# Patient Record
Sex: Female | Born: 1995 | Race: White | Hispanic: No | Marital: Single | Smoking: Current some day smoker
Health system: Southern US, Community
[De-identification: ages and names within clinical notes are randomized; demographics above are authoritative.]

## PROBLEM LIST (undated history)

## (undated) VITALS — BP 95/58 | HR 92 | Temp 98.3°F | Resp 16 | Ht 63.39 in | Wt 133.4 lb

## (undated) DIAGNOSIS — F329 Major depressive disorder, single episode, unspecified: Secondary | ICD-10-CM

## (undated) DIAGNOSIS — F99 Mental disorder, not otherwise specified: Secondary | ICD-10-CM

## (undated) DIAGNOSIS — F32A Depression, unspecified: Secondary | ICD-10-CM

## (undated) HISTORY — PX: ABDOMINAL HERNIA REPAIR: SHX539

---
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
## 2016-02-19 DIAGNOSIS — R011 Cardiac murmur, unspecified: Secondary | ICD-10-CM | POA: Diagnosis not present

## 2016-02-19 DIAGNOSIS — Z23 Encounter for immunization: Secondary | ICD-10-CM | POA: Diagnosis not present

## 2016-02-19 DIAGNOSIS — Z0001 Encounter for general adult medical examination with abnormal findings: Secondary | ICD-10-CM | POA: Diagnosis not present

## 2016-03-19 ENCOUNTER — Other Ambulatory Visit (HOSPITAL_COMMUNITY): Payer: Self-pay | Admitting: Family

## 2016-03-19 DIAGNOSIS — R011 Cardiac murmur, unspecified: Secondary | ICD-10-CM

## 2016-04-01 DIAGNOSIS — Z113 Encounter for screening for infections with a predominantly sexual mode of transmission: Secondary | ICD-10-CM | POA: Diagnosis not present

## 2016-04-01 DIAGNOSIS — Z6822 Body mass index (BMI) 22.0-22.9, adult: Secondary | ICD-10-CM | POA: Diagnosis not present

## 2016-04-01 DIAGNOSIS — Z01419 Encounter for gynecological examination (general) (routine) without abnormal findings: Secondary | ICD-10-CM | POA: Diagnosis not present

## 2016-04-07 ENCOUNTER — Other Ambulatory Visit: Payer: Self-pay

## 2016-04-07 ENCOUNTER — Ambulatory Visit (HOSPITAL_COMMUNITY): Payer: BLUE CROSS/BLUE SHIELD | Attending: Cardiology

## 2016-04-07 DIAGNOSIS — R011 Cardiac murmur, unspecified: Secondary | ICD-10-CM | POA: Diagnosis not present

## 2016-04-07 LAB — ECHOCARDIOGRAM COMPLETE
CHL CUP DOP CALC LVOT VTI: 25 cm
CHL CUP MV DEC (S): 257
CHL CUP RV SYS PRESS: 13 mmHg
E/e' ratio: 5.01
EWDT: 257 ms
FS: 36 % (ref 28–44)
IV/PV OW: 1.12
LA ID, A-P, ES: 27 mm
LADIAMINDEX: 1.63 cm/m2
LAVOLA4C: 26.4 mL
LDCA: 2.54 cm2
LEFT ATRIUM END SYS DIAM: 27 mm
LV PW d: 7.1 mm — AB (ref 0.6–1.1)
LV TDI E'LATERAL: 19.1
LVEEAVG: 5.01
LVEEMED: 5.01
LVELAT: 19.1 cm/s
LVOT peak vel: 97.5 cm/s
LVOTD: 18 mm
LVOTSV: 64 mL
MVPG: 4 mmHg
MVPKAVEL: 41.2 m/s
MVPKEVEL: 95.6 m/s
Peak grad: 157 mmHg
RV LATERAL S' VELOCITY: 13.2 cm/s
Reg peak vel: 157 cm/s
TAPSE: 20 mm
TDI e' medial: 13.7
TR max vel: 157 cm/s

## 2016-05-04 DIAGNOSIS — Z3200 Encounter for pregnancy test, result unknown: Secondary | ICD-10-CM | POA: Diagnosis not present

## 2016-05-04 DIAGNOSIS — N870 Mild cervical dysplasia: Secondary | ICD-10-CM | POA: Diagnosis not present

## 2016-05-04 DIAGNOSIS — R87610 Atypical squamous cells of undetermined significance on cytologic smear of cervix (ASC-US): Secondary | ICD-10-CM | POA: Diagnosis not present

## 2016-05-17 DIAGNOSIS — R300 Dysuria: Secondary | ICD-10-CM | POA: Diagnosis not present

## 2016-05-17 DIAGNOSIS — N390 Urinary tract infection, site not specified: Secondary | ICD-10-CM | POA: Diagnosis not present

## 2016-06-08 DIAGNOSIS — Z113 Encounter for screening for infections with a predominantly sexual mode of transmission: Secondary | ICD-10-CM | POA: Diagnosis not present

## 2016-06-08 DIAGNOSIS — R399 Unspecified symptoms and signs involving the genitourinary system: Secondary | ICD-10-CM | POA: Diagnosis not present

## 2016-06-08 DIAGNOSIS — R8299 Other abnormal findings in urine: Secondary | ICD-10-CM | POA: Diagnosis not present

## 2016-06-11 DIAGNOSIS — R300 Dysuria: Secondary | ICD-10-CM | POA: Diagnosis not present

## 2016-06-17 DIAGNOSIS — R300 Dysuria: Secondary | ICD-10-CM | POA: Diagnosis not present

## 2016-06-17 DIAGNOSIS — R011 Cardiac murmur, unspecified: Secondary | ICD-10-CM | POA: Diagnosis not present

## 2016-06-17 DIAGNOSIS — R319 Hematuria, unspecified: Secondary | ICD-10-CM | POA: Diagnosis not present

## 2016-06-29 DIAGNOSIS — Z79899 Other long term (current) drug therapy: Secondary | ICD-10-CM | POA: Diagnosis not present

## 2016-06-29 DIAGNOSIS — N739 Female pelvic inflammatory disease, unspecified: Secondary | ICD-10-CM | POA: Diagnosis not present

## 2016-06-29 DIAGNOSIS — R102 Pelvic and perineal pain: Secondary | ICD-10-CM | POA: Diagnosis not present

## 2016-06-29 DIAGNOSIS — F17200 Nicotine dependence, unspecified, uncomplicated: Secondary | ICD-10-CM | POA: Diagnosis not present

## 2016-06-29 DIAGNOSIS — R1030 Lower abdominal pain, unspecified: Secondary | ICD-10-CM | POA: Diagnosis not present

## 2016-06-29 DIAGNOSIS — N730 Acute parametritis and pelvic cellulitis: Secondary | ICD-10-CM | POA: Diagnosis not present

## 2016-06-29 DIAGNOSIS — R109 Unspecified abdominal pain: Secondary | ICD-10-CM | POA: Diagnosis not present

## 2016-06-29 NOTE — ED Triage Notes (Signed)
PT C/O LOWER ABDOMINAL PAIN X8 DAYS. PT STS SHE WENT TO HER PCP TODAY, AND WAS TESTED FOR A POSSIBLE UTI, THE RESULT WAS NEGATIVE. SHE STS TODAY SHE BEGAN WITH CONSTANT NAUSEA. DENIES VOMITING OR DIARRHEA. PT STS THIS PAIN STARTED WHEN HER MENSES BEGAN, BUT HER MENSES IS OVER, BUT THE PAIN IS INCREASING.

## 2016-06-30 ENCOUNTER — Emergency Department (HOSPITAL_COMMUNITY)
Admission: EM | Admit: 2016-06-30 | Discharge: 2016-06-30 | Disposition: A | Discharge status: Home / Self Care | Payer: BLUE CROSS/BLUE SHIELD | Attending: Emergency Medicine | Admitting: Emergency Medicine

## 2016-06-30 ENCOUNTER — Encounter (HOSPITAL_COMMUNITY): Payer: Self-pay

## 2016-06-30 ENCOUNTER — Emergency Department (HOSPITAL_COMMUNITY): Payer: BLUE CROSS/BLUE SHIELD

## 2016-06-30 DIAGNOSIS — R109 Unspecified abdominal pain: Secondary | ICD-10-CM | POA: Diagnosis not present

## 2016-06-30 DIAGNOSIS — R102 Pelvic and perineal pain: Secondary | ICD-10-CM

## 2016-06-30 DIAGNOSIS — N730 Acute parametritis and pelvic cellulitis: Secondary | ICD-10-CM

## 2016-06-30 LAB — URINALYSIS, ROUTINE W REFLEX MICROSCOPIC
BILIRUBIN URINE: NEGATIVE
GLUCOSE, UA: NEGATIVE mg/dL
HGB URINE DIPSTICK: NEGATIVE
Ketones, ur: NEGATIVE mg/dL
Leukocytes, UA: NEGATIVE
Nitrite: NEGATIVE
Protein, ur: NEGATIVE mg/dL
SPECIFIC GRAVITY, URINE: 1.006 (ref 1.005–1.030)
pH: 6.5 (ref 5.0–8.0)

## 2016-06-30 LAB — GC/CHLAMYDIA PROBE AMP (~~LOC~~) NOT AT ARMC
Chlamydia: NEGATIVE
Neisseria Gonorrhea: NEGATIVE

## 2016-06-30 LAB — CBC
HCT: 38.8 % (ref 36.0–46.0)
Hemoglobin: 13.3 g/dL (ref 12.0–15.0)
MCH: 31 pg (ref 26.0–34.0)
MCHC: 34.3 g/dL (ref 30.0–36.0)
MCV: 90.4 fL (ref 78.0–100.0)
PLATELETS: 334 10*3/uL (ref 150–400)
RBC: 4.29 MIL/uL (ref 3.87–5.11)
RDW: 12.7 % (ref 11.5–15.5)
WBC: 7.1 10*3/uL (ref 4.0–10.5)

## 2016-06-30 LAB — COMPREHENSIVE METABOLIC PANEL
ALBUMIN: 4.2 g/dL (ref 3.5–5.0)
ALK PHOS: 60 U/L (ref 38–126)
ALT: 42 U/L (ref 14–54)
AST: 30 U/L (ref 15–41)
Anion gap: 5 (ref 5–15)
BUN: 13 mg/dL (ref 6–20)
CALCIUM: 9.1 mg/dL (ref 8.9–10.3)
CO2: 26 mmol/L (ref 22–32)
CREATININE: 0.61 mg/dL (ref 0.44–1.00)
Chloride: 108 mmol/L (ref 101–111)
GFR calc Af Amer: >60 mL/min (ref >60)
GFR calc non Af Amer: >60 mL/min (ref >60)
GLUCOSE: 100 mg/dL — AB (ref 65–99)
Potassium: 3.3 mmol/L — ABNORMAL LOW (ref 3.5–5.1)
Sodium: 139 mmol/L (ref 135–145)
Total Bilirubin: 0.5 mg/dL (ref 0.3–1.2)
Total Protein: 7.1 g/dL (ref 6.5–8.1)

## 2016-06-30 LAB — WET PREP, GENITAL
Clue Cells Wet Prep HPF POC: NONE SEEN
SPERM: NONE SEEN
TRICH WET PREP: NONE SEEN
WBC WET PREP: NONE SEEN
Yeast Wet Prep HPF POC: NONE SEEN

## 2016-06-30 LAB — LIPASE, BLOOD: Lipase: 27 U/L (ref 11–51)

## 2016-06-30 LAB — HCG, QUANTITATIVE, PREGNANCY

## 2016-06-30 MED ORDER — CEFTRIAXONE SODIUM 250 MG IJ SOLR
250.0000 mg | Freq: Once | INTRAMUSCULAR | Status: AC
  Administered 2016-06-30: 250 mg via INTRAMUSCULAR
  Filled 2016-06-30: qty 250

## 2016-06-30 MED ORDER — NAPROXEN 500 MG PO TABS: 500.0000 mg | ORAL_TABLET | Freq: Two times a day (BID) | ORAL | 0 refills | Status: AC

## 2016-06-30 MED ORDER — LIDOCAINE HCL 1 % IJ SOLN
INTRAMUSCULAR | Status: AC
  Administered 2016-06-30: 2 mL
  Filled 2016-06-30: qty 20

## 2016-06-30 MED ORDER — KETOROLAC TROMETHAMINE 30 MG/ML IJ SOLN
30.0000 mg | Freq: Once | INTRAMUSCULAR | Status: AC
  Administered 2016-06-30: 30 mg via INTRAVENOUS
  Filled 2016-06-30: qty 1

## 2016-06-30 MED ORDER — DOXYCYCLINE HYCLATE 100 MG PO CAPS: 100.0000 mg | ORAL_CAPSULE | Freq: Two times a day (BID) | ORAL | 0 refills | Status: AC

## 2016-06-30 MED ORDER — IOPAMIDOL (ISOVUE-300) INJECTION 61%
100.0000 mL | Freq: Once | INTRAVENOUS | Status: AC | PRN
  Administered 2016-06-30: 100 mL via INTRAVENOUS

## 2016-06-30 MED ORDER — POLYETHYLENE GLYCOL 3350 17 GM/SCOOP PO POWD: 17.0000 g | Freq: Every day | ORAL | 0 refills | Status: AC

## 2016-06-30 MED ORDER — ONDANSETRON HCL 4 MG/2ML IJ SOLN
4.0000 mg | Freq: Once | INTRAMUSCULAR | Status: AC
  Administered 2016-06-30: 4 mg via INTRAVENOUS
  Filled 2016-06-30: qty 2

## 2016-06-30 NOTE — ED Notes (Signed)
In CT

## 2016-06-30 NOTE — ED Provider Notes (Signed)
[facility]-EMERGENCY DEPT Provider Note   CSN: [id number] Arrival date & time: 06/29/16  2352 By signing my name below, I, Anita Cook, attest that this documentation has been prepared under the direction and in the presence of non-physician practitioner, Antony MaduraKelly Xan Ingraham, PA-C Electronically Signed: Levon HedgerElizabeth Cook, Scribe. 06/30/2016. 1:31 AM.   History   Chief Complaint Chief Complaint  Patient presents with  . Abdominal Pain    HPI Anita Cook is a 20 y.o. female who presents to the Emergency Department complaining of gradually worsening, constant abdominal pain. Pt states the pain is mostly in her lower abdomen, but will occasionally move to different locations in her abdomen. She describes the pain as "hot", sharp, and shooting. Pain is unchanged by eating or bowel movements. She has never had theses symptoms before. She notes associated constant nausea. She states she has had 2 UTI's in the past 2 months and an abnormal pap smear. Her partner has herpes type 2; pt tested negative for STD's a few months ago. Pt denies fever, dysuria, vaginal bleeding, vaginal discharge, abnormal menstrual cycle. No abdominal shx.   The history is provided by the patient. No language interpreter was used.    Past Medical History:  Diagnosis Date  . Depression   . Mental disorder     Patient Active Problem List   Diagnosis Date Noted  . Recurrent major depression-severe (HCC) 06/14/2012  . GAD (generalized anxiety disorder) 06/14/2012  . Oppositional defiant disorder 06/14/2012  . Cannabis abuse 06/14/2012  . Acute stress disorder 06/14/2012    Past Surgical History:  Procedure Laterality Date  . ABDOMINAL HERNIA REPAIR  newborn  . WISDOM TOOTH EXTRACTION  2012    OB History    No data available     Home Medications    Prior to Admission medications   Medication Sig Start Date End Date Taking? Authorizing Provider  Multiple Vitamins-Minerals (MULTI-VITAMIN GUMMIES PO) Take 1 tablet by  mouth daily.   Yes Historical Provider, MD  clorazepate (TRANXENE) 7.5 MG tablet Take 1 tablet (7.5 mg total) by mouth 3 (three) times daily as needed (acute stress anxiety). Patient not taking: Reported on 06/30/2016 06/21/12   Jolene SchimkeKim B Winson, NP  doxycycline (VIBRAMYCIN) 100 MG capsule Take 1 capsule (100 mg total) by mouth 2 (two) times daily. 06/30/16   Antony MaduraKelly Lulabelle Desta, PA-C  lamoTRIgine (LAMICTAL) 200 MG tablet Take 1 tablet (200 mg total) by mouth at bedtime. Patient not taking: Reported on 06/30/2016 06/21/12   Jolene SchimkeKim B Winson, NP  mirtazapine (REMERON) 15 MG tablet Take 1 tablet (15 mg total) by mouth at bedtime. Patient not taking: Reported on 06/30/2016 06/21/12   Jolene SchimkeKim B Winson, NP  naproxen (NAPROSYN) 500 MG tablet Take 1 tablet (500 mg total) by mouth 2 (two) times daily. 06/30/16   Antony MaduraKelly Hampton Cost, PA-C  PARoxetine (PAXIL) 10 MG tablet Take 1.5 pills (15mg  total) by mouth for two weeks, Take 1 pills (10mg  total) by mouth for two weeks, then 1/2 pill (5 mg total) by mouth for two weeks.  Patient is on a medication taper. Patient not taking: Reported on 06/30/2016 06/21/12   Jolene SchimkeKim B Winson, NP  polyethylene glycol powder (GLYCOLAX/MIRALAX) powder Take 17 g by mouth daily. Until daily soft stools  OTC 06/30/16   Antony MaduraKelly Shelby Anderle, PA-C    Family History Family History  Problem Relation Age of Onset  . Bipolar disorder Mother   . Drug abuse Mother   . ADD / ADHD Brother   . Autism Cousin   .  Autism Cousin    Social History Social History  Substance Use Topics  . Smoking status: Current Some Day Smoker    Packs/day: 1.00    Years: 5.00  . Smokeless tobacco: Never Used  . Alcohol use No     Comment: OCCASIONAL    Allergies   Review of patient's allergies indicates no known allergies.   Review of Systems Review of Systems  10 systems reviewed and all are negative for acute change except as noted in the HPI.  Physical Exam Updated Vital Signs BP 105/62 (BP Location: Right Arm)   Pulse 67   Temp  97.9 F (36.6 C) (Oral)   Resp 20   Ht 5\' 4"  (1.626 m)   Wt 56.7 kg   LMP 06/23/2016 Comment: negative beta HCG 06/30/16  SpO2 98%   BMI 21.46 kg/m   Physical Exam  Constitutional: She is oriented to person, place, and time. She appears well-developed and well-nourished. No distress.  Nontoxic appearing, in NAD  HENT:  Head: Normocephalic and atraumatic.  Eyes: Conjunctivae and EOM are normal. No scleral icterus.  Neck: Normal range of motion.  Cardiovascular: Normal rate, regular rhythm and intact distal pulses.   Pulmonary/Chest: Effort normal. No respiratory distress. She has no wheezes. She has no rales.  Lungs CTAB  Abdominal: Soft. She exhibits no distension and no mass. There is tenderness. There is no guarding.  RLQ and LLQ. There is also suprapubic TTP. No peritoneal signs or masses.  Genitourinary: There is no rash, tenderness or lesion on the right labia. There is no rash, tenderness or lesion on the left labia. Uterus is tender. Cervix exhibits no motion tenderness and no friability. Right adnexum displays tenderness. Right adnexum displays no mass and no fullness. Left adnexum displays tenderness. Left adnexum displays no mass and no fullness. No bleeding in the vagina. No foreign body in the vagina. No vaginal discharge found.  Genitourinary Comments: Adnexal and uterine TTP. No cervical friability or discharge.  Musculoskeletal: Normal range of motion.  Neurological: She is alert and oriented to person, place, and time. She exhibits normal muscle tone. Coordination normal.  GCS 15.   Skin: Skin is warm and dry. No rash noted. She is not diaphoretic. No erythema. No pallor.  Psychiatric: She has a normal mood and affect. Her behavior is normal.  Nursing note and vitals reviewed.   ED Treatments / Results  DIAGNOSTIC STUDIES:  Oxygen Saturation is 100% on RA, normal by my interpretation.    COORDINATION OF CARE:  1:31 AM Discussed treatment plan with pt at bedside  and pt agreed to plan.   Labs (all labs ordered are listed, but only abnormal results are displayed) Labs Reviewed  COMPREHENSIVE METABOLIC PANEL - Abnormal; Notable for the following:       Result Value   Potassium 3.3 (*)    Glucose, Bld 100 (*)    All other components within normal limits  WET PREP, GENITAL  LIPASE, BLOOD  CBC  URINALYSIS, ROUTINE W REFLEX MICROSCOPIC (NOT AT [facility])  HCG, QUANTITATIVE, PREGNANCY  GC/CHLAMYDIA PROBE AMP ([place]) NOT AT [facility]    EKG  EKG Interpretation None       Radiology US Transvaginal Non-ob  Result Date: 06/30/2016 CLINICAL DATA:  20 year old female with pelvic pain. EXAM: TRANSABDOMINAL AND TRANSVAGINAL ULTRASOUND OF PELVIS DOPPLER ULTRASOUND OF OVARIES TECHNIQUE: Both transabdominal and transvaginal ultrasound examinations of the pelvis were performed. Transabdominal technique was performed for global imaging of the pelvis including uterus, ovaries,  adnexal regions, and pelvic cul-de-sac. It was necessary to proceed with endovaginal exam following the transabdominal exam to visualize the endometrium and ovaries. Color and duplex Doppler ultrasound was utilized to evaluate blood flow to the ovaries. COMPARISON:  None. FINDINGS: Uterus Measurements: 7.0 x 3.1 x 4.4 cm. No fibroids or other mass visualized. Endometrium Thickness: 0.5 mm.  No focal abnormality visualized. Right ovary Measurements: 3.1 x 2.6 x 2.5 cm. Normal appearance/no adnexal mass. Left ovary Measurements: 3.5 x 2.3 x 1.9 cm. Normal appearance/no adnexal mass. Pulsed Doppler evaluation of both ovaries demonstrates normal low-resistance arterial and venous waveforms. Other findings Trace free fluid within the pelvis. IMPRESSION: Unremarkable pelvic ultrasound. Electronically Signed   By: Elgie Collard M.D.   On: 06/30/2016 02:58   US Pelvis Complete  Result Date: 06/30/2016 CLINICAL DATA:  20 year old female with pelvic pain. EXAM: TRANSABDOMINAL AND TRANSVAGINAL  ULTRASOUND OF PELVIS DOPPLER ULTRASOUND OF OVARIES TECHNIQUE: Both transabdominal and transvaginal ultrasound examinations of the pelvis were performed. Transabdominal technique was performed for global imaging of the pelvis including uterus, ovaries, adnexal regions, and pelvic cul-de-sac. It was necessary to proceed with endovaginal exam following the transabdominal exam to visualize the endometrium and ovaries. Color and duplex Doppler ultrasound was utilized to evaluate blood flow to the ovaries. COMPARISON:  None. FINDINGS: Uterus Measurements: 7.0 x 3.1 x 4.4 cm. No fibroids or other mass visualized. Endometrium Thickness: 0.5 mm.  No focal abnormality visualized. Right ovary Measurements: 3.1 x 2.6 x 2.5 cm. Normal appearance/no adnexal mass. Left ovary Measurements: 3.5 x 2.3 x 1.9 cm. Normal appearance/no adnexal mass. Pulsed Doppler evaluation of both ovaries demonstrates normal low-resistance arterial and venous waveforms. Other findings Trace free fluid within the pelvis. IMPRESSION: Unremarkable pelvic ultrasound. Electronically Signed   By: Elgie Collard M.D.   On: 06/30/2016 02:58   Ct Abdomen Pelvis W Contrast  Result Date: 06/30/2016 CLINICAL DATA:  Increasing constant abdominal pain with nausea. Recurrent urinary tract infections. EXAM: CT ABDOMEN AND PELVIS WITH CONTRAST TECHNIQUE: Multidetector CT imaging of the abdomen and pelvis was performed using the standard protocol following bolus administration of intravenous contrast. CONTRAST:  ISOVUE-300 IOPAMIDOL (ISOVUE-300) INJECTION 61% COMPARISON:  Pelvic ultrasound June 30, 2016 at 0222 hours FINDINGS: LOWER CHEST: Lung bases are clear. Included heart size is normal. No pericardial effusion. HEPATOBILIARY: Liver and gallbladder are normal. PANCREAS: Normal. SPLEEN: Normal. ADRENALS/URINARY TRACT: Kidneys are orthotopic, demonstrating symmetric enhancement. No nephrolithiasis, hydronephrosis or solid renal masses. The unopacified  ureters are normal in course and caliber. Urinary bladder is partially distended and unremarkable. Normal adrenal glands. STOMACH/BOWEL: The stomach, small and large bowel are normal in course and caliber without inflammatory changes. Moderate amount of retained large bowel stool. Small amount of small bowel feces compatible with chronic stasis. The appendix is not discretely identified, however there are no inflammatory changes in the right lower quadrant. VASCULAR/LYMPHATIC: Aortoiliac vessels are normal in course and caliber. No lymphadenopathy by CT size criteria. REPRODUCTIVE: Normal. OTHER: No intraperitoneal free fluid or free air. MUSCULOSKELETAL: Nonacute.  Broad levoscoliosis could be positional. IMPRESSION: No acute intra-abdominal or pelvic process. Moderate amount of retained large bowel stool without bowel obstruction. Electronically Signed   By: Awilda Metro M.D.   On: 06/30/2016 04:49   [place] Art/ven Flow Abd Pelv Doppler  Result Date: 06/30/2016 CLINICAL DATA:  20 year old female with pelvic pain. EXAM: TRANSABDOMINAL AND TRANSVAGINAL ULTRASOUND OF PELVIS DOPPLER ULTRASOUND OF OVARIES TECHNIQUE: Both transabdominal and transvaginal ultrasound examinations of the pelvis were performed. Transabdominal  technique was performed for global imaging of the pelvis including uterus, ovaries, adnexal regions, and pelvic cul-de-sac. It was necessary to proceed with endovaginal exam following the transabdominal exam to visualize the endometrium and ovaries. Color and duplex Doppler ultrasound was utilized to evaluate blood flow to the ovaries. COMPARISON:  None. FINDINGS: Uterus Measurements: 7.0 x 3.1 x 4.4 cm. No fibroids or other mass visualized. Endometrium Thickness: 0.5 mm.  No focal abnormality visualized. Right ovary Measurements: 3.1 x 2.6 x 2.5 cm. Normal appearance/no adnexal mass. Left ovary Measurements: 3.5 x 2.3 x 1.9 cm. Normal appearance/no adnexal mass. Pulsed Doppler evaluation of both  ovaries demonstrates normal low-resistance arterial and venous waveforms. Other findings Trace free fluid within the pelvis. IMPRESSION: Unremarkable pelvic ultrasound. Electronically Signed   By: Elgie Collard M.D.   On: 06/30/2016 02:58    Procedures Procedures (including critical care time)  Medications Ordered in ED Medications  ketorolac (TORADOL) 30 MG/ML injection 30 mg (30 mg Intravenous Given 06/30/16 0213)  ondansetron (ZOFRAN) injection 4 mg (4 mg Intravenous Given 06/30/16 0213)  iopamidol (ISOVUE-300) 61 % injection 100 mL (100 mLs Intravenous Contrast Given 06/30/16 0425)  cefTRIAXone (ROCEPHIN) injection 250 mg (250 mg Intramuscular Given 06/30/16 0521)  lidocaine (XYLOCAINE) 1 % (with pres) injection (2 mLs  Given 06/30/16 0521)     Initial Impression / Assessment and Plan / ED Course  I have reviewed the triage vital signs and the nursing notes.  Pertinent labs & imaging results that were available during my care of the patient were reviewed by me and considered in my medical decision making (see chart for details).  Clinical Course    20 year old female presents to the emergency department for persistent lower abdominal and pelvic pain 2-3 weeks.  She is well appearing and afebrile.  Laboratory workup is reassuring.  There is no evidence of urinary tract infection.  Pregnancy test is negative.  Ultrasound imaging of reproductive organs obtained.  There is no evidence of ovarian torsion, ovarian cyst, or TOA.  Given degree of discomfort, CT obtained, which is also negative for acute process.  Given location of symptoms, plan to treat for pelvic inflammatory disease.  The patient has been referred to her OB/GYN as well as a gastroenterologist if pain persists.  Return precautions discussed and provided.  Patient discharged in satisfactory condition with no unaddressed concerns.   Final Clinical Impressions(s) / ED Diagnoses   Final diagnoses:  PID (acute pelvic  inflammatory disease)    New Prescriptions New Prescriptions   DOXYCYCLINE (VIBRAMYCIN) 100 MG CAPSULE    Take 1 capsule (100 mg total) by mouth 2 (two) times daily.   NAPROXEN (NAPROSYN) 500 MG TABLET    Take 1 tablet (500 mg total) by mouth 2 (two) times daily.   POLYETHYLENE GLYCOL POWDER (GLYCOLAX/MIRALAX) POWDER    Take 17 g by mouth daily. Until daily soft stools  OTC   I personally performed the services described in this documentation, which was scribed in my presence. The recorded information has been reviewed and is accurate.      Antony Madura, PA-C 06/30/16 0542    Tilden Fossa, MD 06/30/16 [phone]

## 2016-06-30 NOTE — Discharge Instructions (Signed)
Take naproxen as needed for pain and doxycycline for pelvic inflammatory disease.  Do not stop your antibiotics early; take the full course.  Follow up with an OB/GYN regarding your visit to the emergency department today.  You may also follow up with a gastroenterologist if you are abdominal pain persists.  There are CT showed that you may be constipated.  You have been prescribed MiraLAX for this reason, to take daily.  Return to the emergency department for any new or concerning symptoms.

## 2016-06-30 NOTE — ED Notes (Signed)
US in room 

## 2016-07-06 DIAGNOSIS — N760 Acute vaginitis: Secondary | ICD-10-CM | POA: Diagnosis not present

## 2016-07-06 DIAGNOSIS — K5900 Constipation, unspecified: Secondary | ICD-10-CM | POA: Diagnosis not present

## 2016-07-06 DIAGNOSIS — R102 Pelvic and perineal pain: Secondary | ICD-10-CM | POA: Diagnosis not present

## 2016-07-06 DIAGNOSIS — R1030 Lower abdominal pain, unspecified: Secondary | ICD-10-CM | POA: Diagnosis not present

## 2016-07-06 DIAGNOSIS — Z113 Encounter for screening for infections with a predominantly sexual mode of transmission: Secondary | ICD-10-CM | POA: Diagnosis not present

## 2016-10-07 DIAGNOSIS — M79641 Pain in right hand: Secondary | ICD-10-CM | POA: Diagnosis not present

## 2016-11-06 IMAGING — CT CT ABD-PELV W/ CM
2 of 4 series · 16 of 46 positions shown, 18 images · IV contrast (ISOVUE)
Comparison: Pelvic ultrasound June 30, 2016 at 8666 hours

CLINICAL DATA: Increasing constant abdominal pain with nausea.
Recurrent urinary tract infections.

EXAM:
CT ABDOMEN AND PELVIS WITH CONTRAST
TECHNIQUE: Multidetector CT imaging of the abdomen and pelvis was performed
using the standard protocol following bolus administration of
intravenous contrast.
CONTRAST:  100mL N5I3TI-SOO IOPAMIDOL (N5I3TI-SOO) INJECTION 61%

[Series 2: abd/pel with · axial · 0.66mm/px · z∈[+1241,+1631]mm · 13 of 86 slices shown, 15 images]
[im 4/86  soft-tissue]
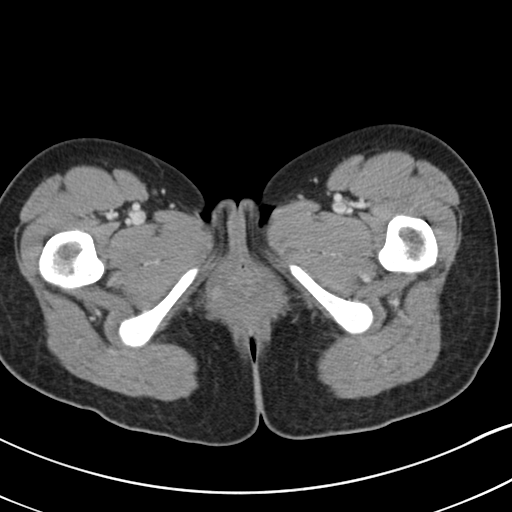
[im 4/86  bone]
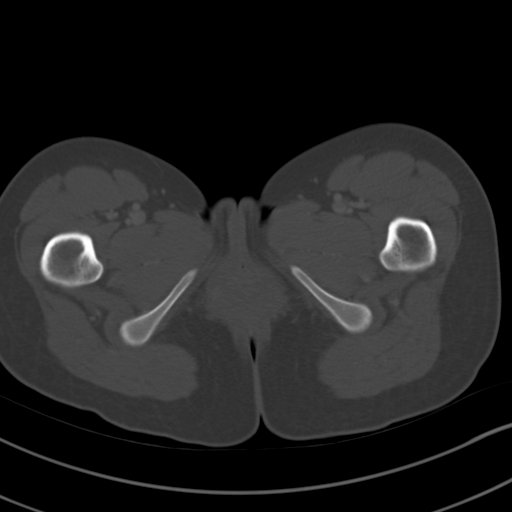
[im 12/86  soft-tissue]
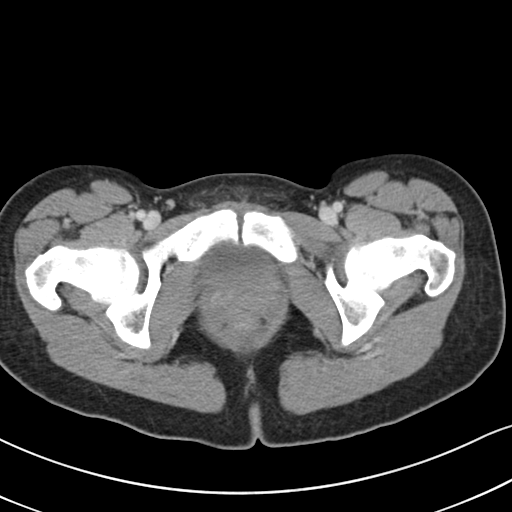
[im 19/86  soft-tissue]
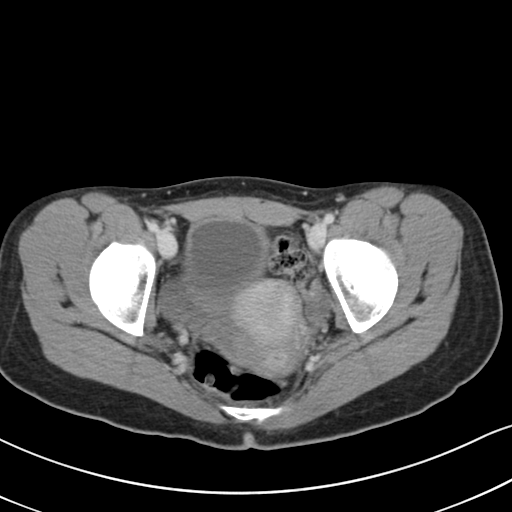
[im 23/86  soft-tissue]
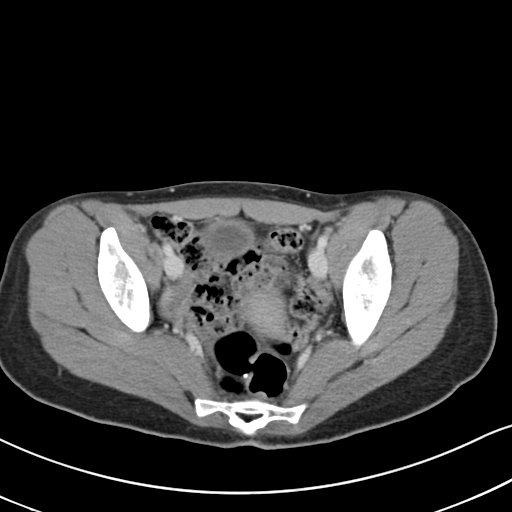
[im 30/86  soft-tissue]
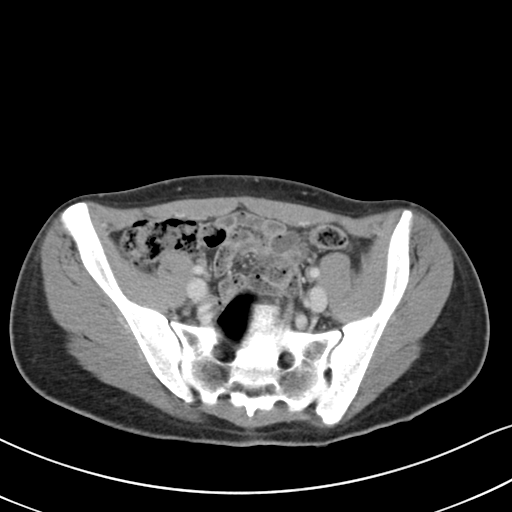
[im 37/86  soft-tissue]
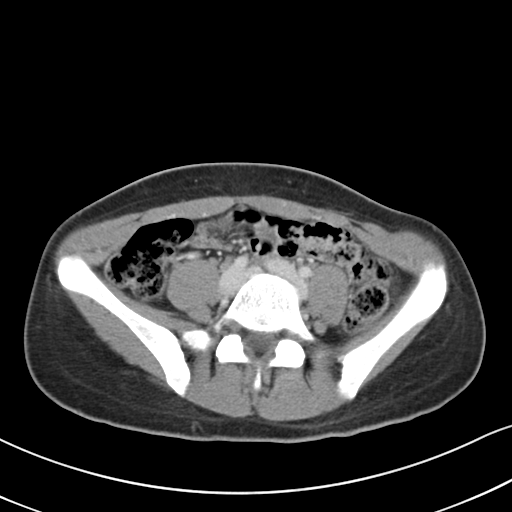
[im 45/86  soft-tissue]
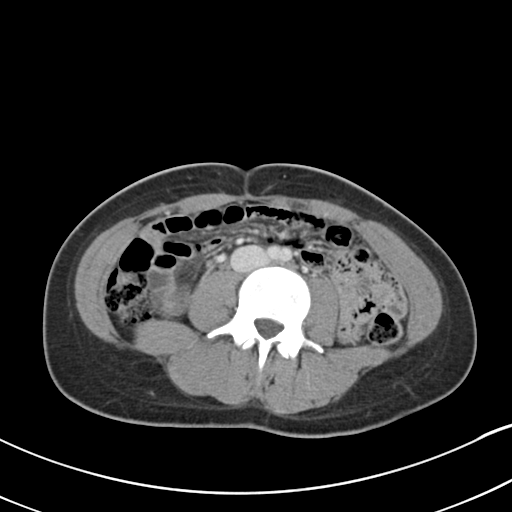
[im 49/86  soft-tissue]
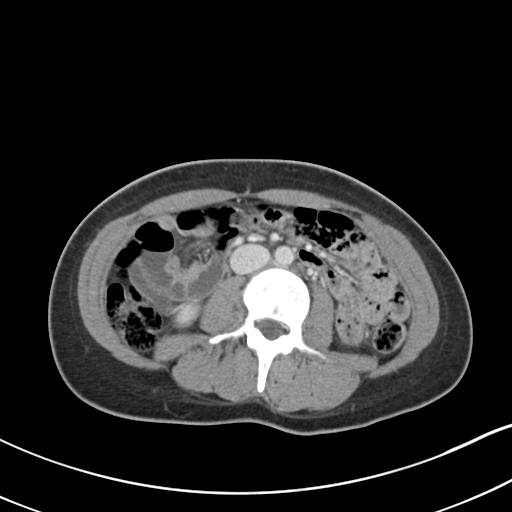
[im 56/86  soft-tissue]
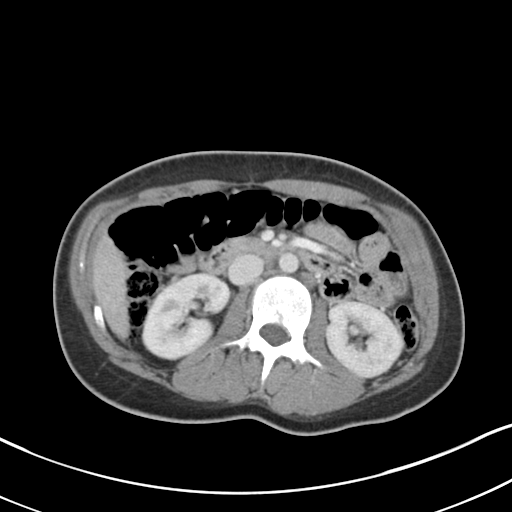
[im 56/86  bone]
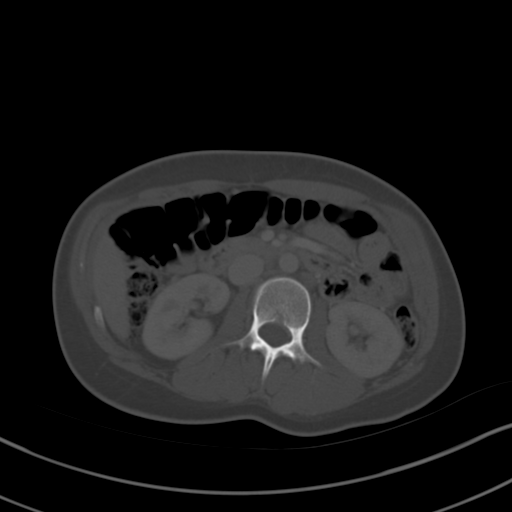
[im 63/86  soft-tissue]
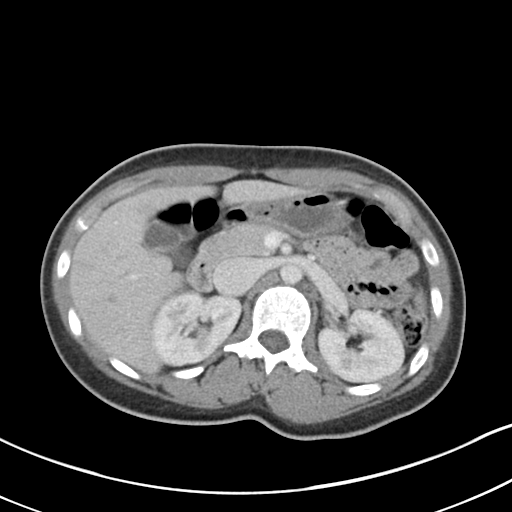
[im 67/86  soft-tissue]
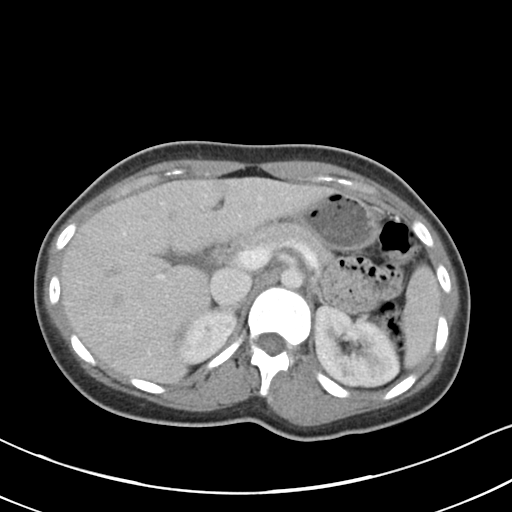
[im 74/86  soft-tissue]
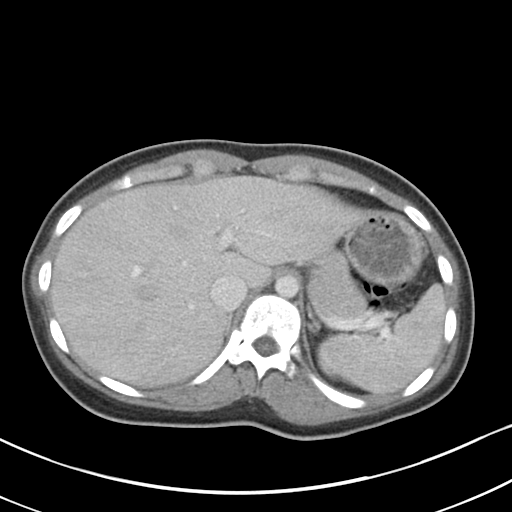
[im 82/86  soft-tissue]
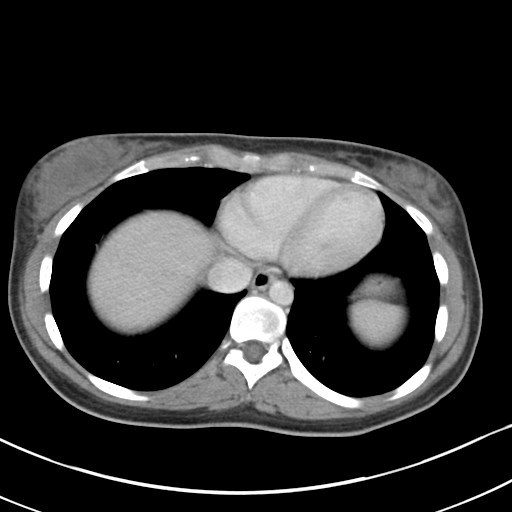

[Series 4: coronal a/|p · coronal · 0.60mm/px · 3 of 98 slices shown]
[im 33/98  soft-tissue]
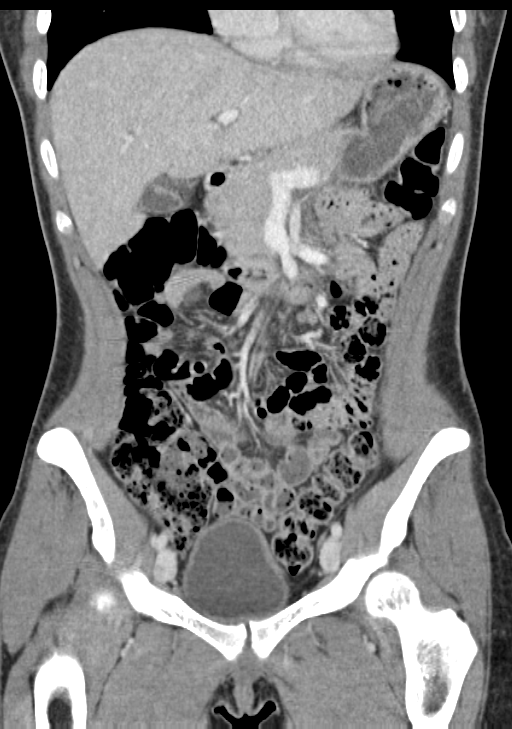
[im 44/98  soft-tissue]
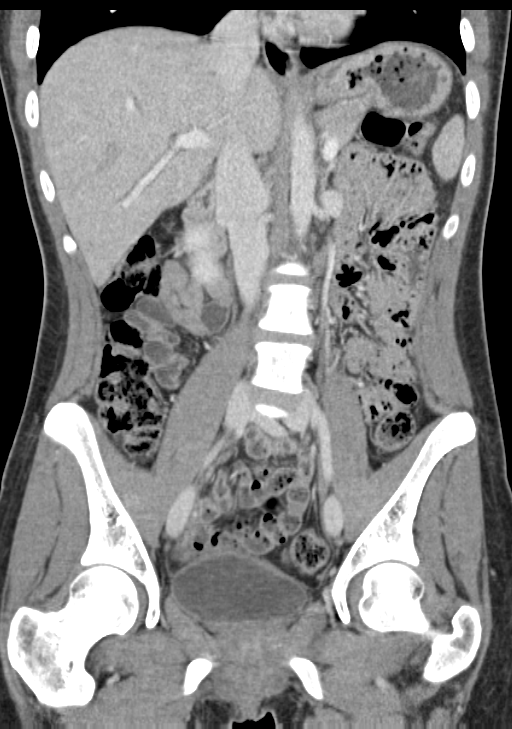
[im 54/98  soft-tissue]
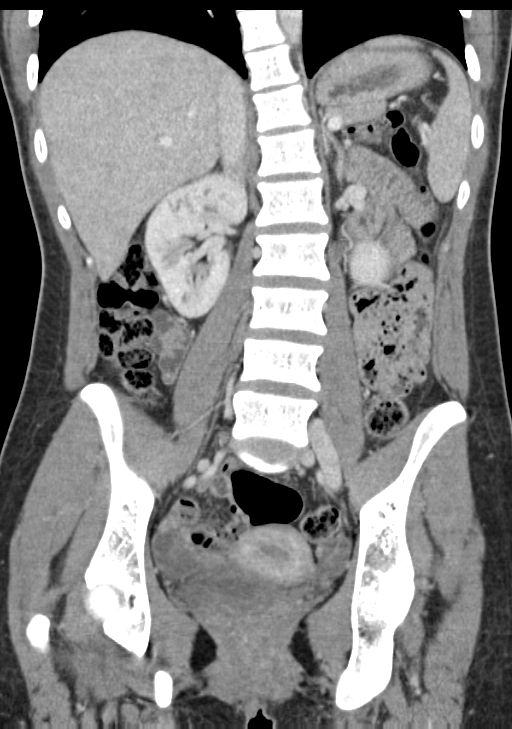

[16 of 46 positions shown; findings below may reference images not displayed]

FINDINGS: LOWER CHEST: Lung bases are clear. Included heart size is normal. No
pericardial effusion.

HEPATOBILIARY: Liver and gallbladder are normal.

PANCREAS: Normal.

SPLEEN: Normal.

ADRENALS/URINARY TRACT: Kidneys are orthotopic, demonstrating
symmetric enhancement. No nephrolithiasis, hydronephrosis or solid
renal masses. The unopacified ureters are normal in course and
caliber. Urinary bladder is partially distended and unremarkable.
Normal adrenal glands.

STOMACH/BOWEL: The stomach, small and large bowel are normal in
course and caliber without inflammatory changes. Moderate amount of
retained large bowel stool. Small amount of small bowel feces
compatible with chronic stasis. The appendix is not discretely
identified, however there are no inflammatory changes in the right
lower quadrant.

VASCULAR/LYMPHATIC: Aortoiliac vessels are normal in course and
caliber. No lymphadenopathy by CT size criteria.

REPRODUCTIVE: Normal.

OTHER: No intraperitoneal free fluid or free air.

MUSCULOSKELETAL: Nonacute.  Broad levoscoliosis could be positional.
IMPRESSION: No acute intra-abdominal or pelvic process.

Moderate amount of retained large bowel stool without bowel
obstruction.

## 2016-12-17 DIAGNOSIS — M25511 Pain in right shoulder: Secondary | ICD-10-CM | POA: Diagnosis not present

## 2016-12-17 DIAGNOSIS — M545 Low back pain: Secondary | ICD-10-CM | POA: Diagnosis not present

## 2016-12-17 DIAGNOSIS — M542 Cervicalgia: Secondary | ICD-10-CM | POA: Diagnosis not present

## 2016-12-17 DIAGNOSIS — M546 Pain in thoracic spine: Secondary | ICD-10-CM | POA: Diagnosis not present

## 2016-12-18 DIAGNOSIS — M79641 Pain in right hand: Secondary | ICD-10-CM | POA: Diagnosis not present

## 2017-04-20 DIAGNOSIS — Z202 Contact with and (suspected) exposure to infections with a predominantly sexual mode of transmission: Secondary | ICD-10-CM | POA: Diagnosis not present

## 2017-05-18 DIAGNOSIS — M9901 Segmental and somatic dysfunction of cervical region: Secondary | ICD-10-CM | POA: Diagnosis not present

## 2017-05-18 DIAGNOSIS — M545 Low back pain: Secondary | ICD-10-CM | POA: Diagnosis not present

## 2017-05-18 DIAGNOSIS — M546 Pain in thoracic spine: Secondary | ICD-10-CM | POA: Diagnosis not present

## 2017-05-18 DIAGNOSIS — M542 Cervicalgia: Secondary | ICD-10-CM | POA: Diagnosis not present

## 2017-07-17 DIAGNOSIS — Q839 Congenital malformation of breast, unspecified: Secondary | ICD-10-CM | POA: Diagnosis not present

## 2017-08-03 DIAGNOSIS — Z01419 Encounter for gynecological examination (general) (routine) without abnormal findings: Secondary | ICD-10-CM | POA: Diagnosis not present

## 2017-08-03 DIAGNOSIS — Z6820 Body mass index (BMI) 20.0-20.9, adult: Secondary | ICD-10-CM | POA: Diagnosis not present

## 2017-08-30 DIAGNOSIS — F331 Major depressive disorder, recurrent, moderate: Secondary | ICD-10-CM | POA: Diagnosis not present

## 2017-10-04 DIAGNOSIS — F331 Major depressive disorder, recurrent, moderate: Secondary | ICD-10-CM | POA: Diagnosis not present

## 2017-10-05 DIAGNOSIS — N644 Mastodynia: Secondary | ICD-10-CM | POA: Diagnosis not present

## 2017-10-11 DIAGNOSIS — F331 Major depressive disorder, recurrent, moderate: Secondary | ICD-10-CM | POA: Diagnosis not present

## 2017-10-25 DIAGNOSIS — F331 Major depressive disorder, recurrent, moderate: Secondary | ICD-10-CM | POA: Diagnosis not present

## 2018-05-16 DIAGNOSIS — Q839 Congenital malformation of breast, unspecified: Secondary | ICD-10-CM | POA: Diagnosis not present

## 2018-11-02 DIAGNOSIS — Z0000 Encounter for general adult medical examination without abnormal findings: Secondary | ICD-10-CM | POA: Diagnosis not present

## 2018-11-02 DIAGNOSIS — Z13220 Encounter for screening for lipoid disorders: Secondary | ICD-10-CM | POA: Diagnosis not present

## 2019-05-29 DIAGNOSIS — Z207 Contact with and (suspected) exposure to pediculosis, acariasis and other infestations: Secondary | ICD-10-CM | POA: Diagnosis not present

## 2019-05-30 DIAGNOSIS — Z207 Contact with and (suspected) exposure to pediculosis, acariasis and other infestations: Secondary | ICD-10-CM | POA: Diagnosis not present

## 2019-06-25 DIAGNOSIS — Z1159 Encounter for screening for other viral diseases: Secondary | ICD-10-CM | POA: Diagnosis not present

## 2019-06-29 ENCOUNTER — Other Ambulatory Visit: Payer: Self-pay | Admitting: Emergency Medicine

## 2019-06-29 DIAGNOSIS — Z20822 Contact with and (suspected) exposure to covid-19: Secondary | ICD-10-CM

## 2019-06-30 LAB — NOVEL CORONAVIRUS, NAA: SARS-CoV-2, NAA: NOT DETECTED

## 2019-09-30 DIAGNOSIS — Z03818 Encounter for observation for suspected exposure to other biological agents ruled out: Secondary | ICD-10-CM | POA: Diagnosis not present

## 2019-11-13 ENCOUNTER — Ambulatory Visit: Payer: Self-pay

## 2019-11-13 ENCOUNTER — Ambulatory Visit: Payer: BC Managed Care – PPO | Attending: Internal Medicine

## 2019-11-13 DIAGNOSIS — Z20822 Contact with and (suspected) exposure to covid-19: Secondary | ICD-10-CM

## 2019-11-13 DIAGNOSIS — Z13220 Encounter for screening for lipoid disorders: Secondary | ICD-10-CM | POA: Diagnosis not present

## 2019-11-13 DIAGNOSIS — Z0000 Encounter for general adult medical examination without abnormal findings: Secondary | ICD-10-CM | POA: Diagnosis not present

## 2019-11-14 LAB — NOVEL CORONAVIRUS, NAA: SARS-CoV-2, NAA: NOT DETECTED

## 2019-12-02 ENCOUNTER — Ambulatory Visit: Payer: BC Managed Care – PPO | Attending: Internal Medicine

## 2019-12-02 DIAGNOSIS — Z23 Encounter for immunization: Secondary | ICD-10-CM

## 2019-12-02 NOTE — Progress Notes (Signed)
   Covid-[facility] Vaccination [facility]  Name:  Anita Cook    MRN: [id number] DOB: [birth year]  12/02/2019  Anita Cook was observed post Covid-19 immunization for 15 minutes without incident. She was provided with Vaccine Information Sheet and instruction to access the V-Safe system.   Anita Cook was instructed to call 911 with any severe reactions post vaccine: Marland Kitchen Difficulty breathing  . Swelling of face and throat  . A fast heartbeat  . A bad rash all over body  . Dizziness and weakness   Immunizations Administered    Name Date Dose VIS Date Route   Pfizer COVID-19 Vaccine 12/02/2019  9:49 AM 0.3 mL 09/08/2019 Intramuscular   Manufacturer: [employer], [employer]   Lot: [id number]   NDC: [address]-5

## 2019-12-23 ENCOUNTER — Ambulatory Visit: Payer: BC Managed Care – PPO | Attending: Internal Medicine

## 2019-12-23 DIAGNOSIS — Z23 Encounter for immunization: Secondary | ICD-10-CM

## 2019-12-23 NOTE — Progress Notes (Signed)
   Covid-19 Vaccination Clinic  Name:  Anita Cook    MRN: [id number] DOB: July 07, 1996  12/23/2019  [name] was observed post Covid-19 immunization for 15 minutes without incident. She was provided with Vaccine Information Sheet and instruction to access the V-Safe system.   [name] was instructed to call 911 with any severe reactions post vaccine: Marland Kitchen Difficulty breathing  . Swelling of face and throat  . A fast heartbeat  . A bad rash all over body  . Dizziness and weakness   Immunizations Administered    Name Date Dose VIS Date Route   Pfizer COVID-19 Vaccine 12/23/2019  9:52 AM 0.3 mL 09/08/2019 Intramuscular   Manufacturer: [employer], [employer]   Lot: [id number]   NDC: [address]-4
